# Patient Record
Sex: Female | Born: 1975 | Race: White | Hispanic: No | Smoking: Never smoker
Health system: Southern US, Community
[De-identification: ages and names within clinical notes are randomized; demographics above are authoritative.]

## PROBLEM LIST (undated history)

## (undated) DIAGNOSIS — G43909 Migraine, unspecified, not intractable, without status migrainosus: Secondary | ICD-10-CM

## (undated) DIAGNOSIS — J45909 Unspecified asthma, uncomplicated: Secondary | ICD-10-CM

## (undated) DIAGNOSIS — F410 Panic disorder [episodic paroxysmal anxiety] without agoraphobia: Secondary | ICD-10-CM

## (undated) DIAGNOSIS — R51 Headache: Secondary | ICD-10-CM

## (undated) DIAGNOSIS — T7840XA Allergy, unspecified, initial encounter: Secondary | ICD-10-CM

## (undated) DIAGNOSIS — R519 Headache, unspecified: Secondary | ICD-10-CM

## (undated) HISTORY — DX: Unspecified asthma, uncomplicated: J45.909

## (undated) HISTORY — DX: Allergy, unspecified, initial encounter: T78.40XA

## (undated) HISTORY — DX: Headache, unspecified: R51.9

## (undated) HISTORY — DX: Migraine, unspecified, not intractable, without status migrainosus: G43.909

## (undated) HISTORY — DX: Panic disorder (episodic paroxysmal anxiety): F41.0

## (undated) HISTORY — DX: Headache: R51

---

## 1999-04-09 HISTORY — PX: DILATION AND CURETTAGE OF UTERUS: SHX78

## 2012-12-01 ENCOUNTER — Encounter: Payer: Self-pay | Admitting: Adult Health

## 2012-12-01 ENCOUNTER — Ambulatory Visit (INDEPENDENT_AMBULATORY_CARE_PROVIDER_SITE_OTHER): Payer: BC Managed Care – HMO | Admitting: Adult Health

## 2012-12-01 VITALS — BP 118/72 | HR 82 | Temp 98.2°F | Resp 12 | Ht 64.0 in | Wt 148.0 lb

## 2012-12-01 DIAGNOSIS — Z889 Allergy status to unspecified drugs, medicaments and biological substances status: Secondary | ICD-10-CM | POA: Insufficient documentation

## 2012-12-01 DIAGNOSIS — J45909 Unspecified asthma, uncomplicated: Secondary | ICD-10-CM

## 2012-12-01 DIAGNOSIS — Z9109 Other allergy status, other than to drugs and biological substances: Secondary | ICD-10-CM

## 2012-12-01 DIAGNOSIS — R51 Headache: Secondary | ICD-10-CM

## 2012-12-01 DIAGNOSIS — Z0000 Encounter for general adult medical examination without abnormal findings: Secondary | ICD-10-CM

## 2012-12-01 MED ORDER — ALBUTEROL SULFATE HFA 108 (90 BASE) MCG/ACT IN AERS: 2.0000 | INHALATION_SPRAY | Freq: Four times a day (QID) | RESPIRATORY_TRACT | Status: DC | PRN

## 2012-12-01 MED ORDER — TETANUS-DIPHTH-ACELL PERTUSSIS 5-2.5-18.5 LF-MCG/0.5 IM SUSP: 0.5000 mL | Freq: Once | INTRAMUSCULAR | Status: AC

## 2012-12-01 NOTE — Assessment & Plan Note (Addendum)
Normal physical exam excluding breast and PAP. She will schedule this exam at a later time. Tdap vaccine updated today. Check labs: cbc w/diff, bmet, hepatic panel, tsh, lipids, vitamin d. Patient is being referred for allergy testing.

## 2012-12-01 NOTE — Assessment & Plan Note (Signed)
Trigger sinus headaches. Will send for allergy testing.

## 2012-12-01 NOTE — Addendum Note (Signed)
Addended by: Chandra Batch E on: 12/01/2012 03:29 PM   Modules accepted: Orders

## 2012-12-01 NOTE — Progress Notes (Signed)
  Subjective:    Patient ID: Erin [name], female    DOB: 11-Jan-1976, 37 y.o.   MRN: [id number]  HPI  Patient is a pleasant 37 y/o female who presents to clinic to establish care. She has not seen a PCP in approximately 10 years.   Review of Systems  Constitutional: Negative.   HENT: Positive for postnasal drip and sinus pressure.   Eyes: Negative.   Respiratory:       Occasional chest tightness with asthma flare. These are few and far between.  Cardiovascular: Negative.   Gastrointestinal: Negative.   Endocrine: Negative.   Genitourinary: Positive for menstrual problem. Negative for dysuria, urgency, frequency, hematuria, flank pain, vaginal discharge, vaginal pain and pelvic pain.  Musculoskeletal: Positive for back pain. Negative for joint swelling.       Hx of low back pain with sciatic nerve involvement. None at present.  Skin: Negative.   Allergic/Immunologic: Positive for environmental allergies.       Seasonal allergies - mild  Neurological: Positive for headaches. Negative for dizziness, tremors, seizures, syncope, speech difficulty, weakness, light-headedness and numbness.       Hx of headache. Has been getting sinus headache.  Hematological: Negative.   Psychiatric/Behavioral: Negative for suicidal ideas, behavioral problems, confusion, self-injury, decreased concentration and agitation. The patient is nervous/anxious.     BP 118/72  Pulse 82  Temp(Src) 98.2 F (36.8 C) (Oral)  Resp 12  Ht 5\' 4"  (1.626 m)  Wt 148 lb (67.132 kg)  BMI 25.39 kg/m2  SpO2 98%  LMP 11/23/2012    Objective:   Physical Exam  Constitutional: She is oriented to person, place, and time. She appears well-developed and well-nourished. No distress.  HENT:  Head: Normocephalic and atraumatic.  Right Ear: External ear normal.  Left Ear: External ear normal.  Mouth/Throat: Oropharynx is clear and moist.  Eyes: Conjunctivae and EOM are normal. Pupils are equal, round, and reactive to light.   Neck: Normal range of motion. Neck supple. No tracheal deviation present. No thyromegaly present.  Cardiovascular: Normal rate, regular rhythm, normal heart sounds and intact distal pulses.  Exam reveals no gallop and no friction rub.   No murmur heard. Pulmonary/Chest: Effort normal and breath sounds normal. No respiratory distress. She has no wheezes. She has no rales. She exhibits no tenderness.  Abdominal: Soft. Bowel sounds are normal. She exhibits no distension and no mass. There is no tenderness. There is no rebound and no guarding.  Musculoskeletal: Normal range of motion. She exhibits no edema and no tenderness.  Lymphadenopathy:    She has no cervical adenopathy.  Neurological: She is alert and oriented to person, place, and time. She has normal reflexes. She displays no tremor and normal reflexes. No cranial nerve deficit. She exhibits normal muscle tone. Coordination and gait normal.  Skin: Skin is warm and dry. No rash noted. No erythema. No pallor.  Psychiatric: She has a normal mood and affect. Her behavior is normal. Judgment and thought content normal.          Assessment & Plan:

## 2012-12-01 NOTE — Assessment & Plan Note (Signed)
Well controlled. Send in prescription for Albuterol inhaler.

## 2012-12-01 NOTE — Assessment & Plan Note (Signed)
She has been experiencing sinus headaches weekly. She believes they are allergy related. Respond to ibuprofen. Refer for allergy testing. Provided with sample of nasonex nasal spray.

## 2012-12-01 NOTE — Patient Instructions (Addendum)
   Thank you for choosing [facility] for your health care needs.  Please return for fasting labs at your earliest convenience.  The results will be available through MyChart for your convenience. Please remember to activate this. The activation code is located at the end of this form.  I am referring you for Allergy testing.   Remember to schedule your PAP at your earliest convenience.  Please call with any questions or concerns.

## 2012-12-15 ENCOUNTER — Encounter: Payer: Self-pay | Admitting: Adult Health

## 2012-12-15 ENCOUNTER — Other Ambulatory Visit (HOSPITAL_COMMUNITY)
Admission: RE | Admit: 2012-12-15 | Discharge: 2012-12-15 | Disposition: A | Discharge status: Home / Self Care | Payer: BC Managed Care – HMO | Source: Ambulatory Visit | Attending: Adult Health | Admitting: Adult Health

## 2012-12-15 ENCOUNTER — Ambulatory Visit (INDEPENDENT_AMBULATORY_CARE_PROVIDER_SITE_OTHER): Payer: BC Managed Care – HMO | Admitting: Adult Health

## 2012-12-15 VITALS — BP 110/76 | HR 97 | Resp 12 | Ht 64.0 in | Wt 144.0 lb

## 2012-12-15 DIAGNOSIS — Z01419 Encounter for gynecological examination (general) (routine) without abnormal findings: Secondary | ICD-10-CM | POA: Insufficient documentation

## 2012-12-15 DIAGNOSIS — Z124 Encounter for screening for malignant neoplasm of cervix: Secondary | ICD-10-CM

## 2012-12-15 DIAGNOSIS — Z1151 Encounter for screening for human papillomavirus (HPV): Secondary | ICD-10-CM | POA: Insufficient documentation

## 2012-12-15 NOTE — Assessment & Plan Note (Signed)
Physical exam normal including breast exam. PAP sent.

## 2012-12-15 NOTE — Progress Notes (Signed)
  Subjective:    Patient ID: Erin [name], female    DOB: Dec 06, 1975, 37 y.o.   MRN: [id number]  HPI  Patient is a pleasant 37 y/o female who presents to clinic for her breast and pelvic exam.  She has not concerns at this time.   Current Outpatient Prescriptions on File Prior to Visit  Medication Sig Dispense Refill  . albuterol (PROVENTIL HFA;VENTOLIN HFA) 108 (90 BASE) MCG/ACT inhaler Inhale 2 puffs into the lungs every 6 (six) hours as needed for wheezing.  1 Inhaler  6   Current Facility-Administered Medications on File Prior to Visit  Medication Dose Route Frequency Provider Last Rate Last Dose  . TDaP (BOOSTRIX) injection 0.5 mL  0.5 mL Intramuscular Once Saysha Menta, NP         Review of Systems  Constitutional: Negative.   Respiratory: Negative.   Cardiovascular: Negative.   Gastrointestinal: Negative.   Genitourinary: Negative.   Neurological: Negative.   Psychiatric/Behavioral: Negative.   All other systems reviewed and are negative.     BP 110/76  Pulse 97  Resp 12  Ht 5\' 4"  (1.626 m)  Wt 144 lb (65.318 kg)  BMI 24.71 kg/m2  SpO2 99%  LMP 11/23/2012     Objective:   Physical Exam  Constitutional: She appears well-developed and well-nourished. No distress.  Abdominal: Hernia confirmed negative in the right inguinal area and confirmed negative in the left inguinal area.  Genitourinary: Rectum normal, vagina normal and uterus normal. Rectal exam shows no external hemorrhoid and no internal hemorrhoid. Guaiac negative stool.    No breast swelling, tenderness, discharge or bleeding. No labial fusion. There is no rash, tenderness, lesion or injury on the right labia. There is no rash, tenderness, lesion or injury on the left labia. Cervix exhibits no discharge and no friability. Right adnexum displays no mass, no tenderness and no fullness. Left adnexum displays no mass, no tenderness and no fullness. No erythema, tenderness or bleeding around the vagina. No  foreign body around the vagina. No signs of injury around the vagina. No vaginal discharge found.  Lymphadenopathy:       Right: No inguinal adenopathy present.       Left: No inguinal adenopathy present.          Assessment & Plan:

## 2012-12-15 NOTE — Addendum Note (Signed)
Addended by: Montine Circle D on: 12/15/2012 01:42 PM   Modules accepted: Orders

## 2012-12-18 ENCOUNTER — Other Ambulatory Visit (INDEPENDENT_AMBULATORY_CARE_PROVIDER_SITE_OTHER): Payer: BC Managed Care – HMO

## 2012-12-18 DIAGNOSIS — Z0000 Encounter for general adult medical examination without abnormal findings: Secondary | ICD-10-CM

## 2012-12-18 LAB — CBC WITH DIFFERENTIAL/PLATELET
Basophils Absolute: 0 10*3/uL (ref 0.0–0.1)
Basophils Relative: 0.5 % (ref 0.0–3.0)
Eosinophils Absolute: 0.2 10*3/uL (ref 0.0–0.7)
HCT: 38.6 % (ref 36.0–46.0)
Hemoglobin: 13.1 g/dL (ref 12.0–15.0)
Lymphocytes Relative: 21.9 % (ref 12.0–46.0)
Lymphs Abs: 1.3 10*3/uL (ref 0.7–4.0)
MCHC: 33.8 g/dL (ref 30.0–36.0)
MCV: 93.2 fl (ref 78.0–100.0)
Neutro Abs: 4 10*3/uL (ref 1.4–7.7)
RBC: 4.14 Mil/uL (ref 3.87–5.11)
RDW: 13 % (ref 11.5–14.6)

## 2012-12-18 LAB — HEPATIC FUNCTION PANEL
ALT: 9 U/L (ref 0–35)
Albumin: 4.3 g/dL (ref 3.5–5.2)
Alkaline Phosphatase: 53 U/L (ref 39–117)
Bilirubin, Direct: 0.1 mg/dL (ref 0.0–0.3)
Total Protein: 7.4 g/dL (ref 6.0–8.3)

## 2012-12-18 LAB — BASIC METABOLIC PANEL
CO2: 23 mEq/L (ref 19–32)
Calcium: 9.1 mg/dL (ref 8.4–10.5)
Chloride: 110 mEq/L (ref 96–112)
Creatinine, Ser: 0.7 mg/dL (ref 0.4–1.2)
Glucose, Bld: 91 mg/dL (ref 70–99)
Sodium: 137 mEq/L (ref 135–145)

## 2012-12-18 LAB — LIPID PANEL
Cholesterol: 175 mg/dL (ref 0–200)
LDL Cholesterol: 119 mg/dL — ABNORMAL HIGH (ref 0–99)
Triglycerides: 107 mg/dL (ref 0.0–149.0)
VLDL: 21.4 mg/dL (ref 0.0–40.0)

## 2012-12-18 LAB — TSH: TSH: 1.22 u[IU]/mL (ref 0.35–5.50)

## 2013-02-04 ENCOUNTER — Ambulatory Visit (INDEPENDENT_AMBULATORY_CARE_PROVIDER_SITE_OTHER): Payer: BC Managed Care – HMO | Admitting: Adult Health

## 2013-02-04 ENCOUNTER — Encounter: Payer: Self-pay | Admitting: Adult Health

## 2013-02-04 VITALS — BP 120/82 | HR 110 | Temp 98.4°F | Resp 12 | Wt 147.0 lb

## 2013-02-04 DIAGNOSIS — M6580 Other synovitis and tenosynovitis, unspecified site: Secondary | ICD-10-CM

## 2013-02-04 DIAGNOSIS — M778 Other enthesopathies, not elsewhere classified: Secondary | ICD-10-CM | POA: Insufficient documentation

## 2013-02-04 NOTE — Assessment & Plan Note (Addendum)
Medial and lateral epicondylitis - Rest extremity. Avoid repetitive wrist extension and overuse of extremity. Ice, ibuprofen. May consider a counterforce brace applied over the upper portion of the lower arm. If no improvement will consider physical therapy and/or referral to ortho for evaluation and possible corticosteroid injection.

## 2013-02-04 NOTE — Patient Instructions (Signed)
  Take ibuprofen 600 mg every 6 hours for the next 4-5 days. Take with food.  Immobilize the joint - use a sling for a few days. Counter force elbow brace will also help improve symptoms.  Ice the area 4 times a day for the next 3-4 days.  If not any better in 2 weeks please let me know and I will refer you to ortho.

## 2013-02-04 NOTE — Progress Notes (Signed)
  Subjective:    Patient ID: Erin [name], female    DOB: 07/19/1975, 37 y.o.   MRN: [id number]  HPI  Pt is pleasant 37 yo female, presents to clinic with right elbow pain and tingling in right hand since Sunday; denies any acute injury. Pt states she noticed a small lump at elbow 2 days ago.  Pt states pain is worse with palpation and certain movements, particularly driving.  She is ambidextrous   Review of Systems  Musculoskeletal: Positive for arthralgias and joint swelling.       Right elbow pain and mild swelling  Skin: Negative for color change, rash and wound.       Objective:   Physical Exam  Constitutional: She is oriented to person, place, and time. She appears well-developed and well-nourished.  Musculoskeletal: Normal range of motion. She exhibits tenderness.       Right elbow: She exhibits swelling. She exhibits normal range of motion, no effusion and no deformity. Tenderness found. Medial epicondyle and lateral epicondyle tenderness noted. No olecranon process tenderness noted.  Neurological: She is alert and oriented to person, place, and time.  Skin: Skin is warm and dry.  Psychiatric: She has a normal mood and affect. Her behavior is normal. Judgment and thought content normal.    BP 120/82  Pulse 110  Temp(Src) 98.4 F (36.9 C) (Oral)  Resp 12  Wt 147 lb (66.679 kg)  BMI 25.22 kg/m2  SpO2 97%       Assessment & Plan:

## 2013-02-11 ENCOUNTER — Other Ambulatory Visit: Payer: Self-pay

## 2014-04-21 ENCOUNTER — Encounter: Payer: Self-pay | Admitting: Nurse Practitioner

## 2014-04-21 ENCOUNTER — Ambulatory Visit (INDEPENDENT_AMBULATORY_CARE_PROVIDER_SITE_OTHER): Payer: Managed Care, Other (non HMO) | Admitting: Nurse Practitioner

## 2014-04-21 VITALS — BP 118/80 | HR 96 | Temp 98.1°F | Resp 12 | Ht 64.0 in | Wt 133.0 lb

## 2014-04-21 DIAGNOSIS — B309 Viral conjunctivitis, unspecified: Secondary | ICD-10-CM | POA: Insufficient documentation

## 2014-04-21 NOTE — Progress Notes (Signed)
Pre visit review using our clinic review tool, if applicable. No additional management support is needed unless otherwise documented below in the visit note. 

## 2014-04-21 NOTE — Patient Instructions (Addendum)
Ocuhist, Visine-A, or Naphcon-A are good options for eye drops and will help with symptoms.   Mucinex if you are having issues with mucous/congestion, cough drops with menthol for sore throat.   Viral Conjunctivitis Conjunctivitis is an irritation (inflammation) of the clear membrane that covers the white part of the eye (the conjunctiva). The irritation can also happen on the underside of the eyelids. Conjunctivitis makes the eye red or pink in color. This is what is commonly known as pink eye. Viral conjunctivitis can spread easily (contagious). CAUSES   Infection from virus on the surface of the eye.  Infection from the irritation or injury of nearby tissues such as the eyelids or cornea.  More serious inflammation or infection on the inside of the eye.  Other eye diseases.  The use of certain eye medications. SYMPTOMS  The normally white color of the eye or the underside of the eyelid is usually pink or red in color. The pink eye is usually associated with irritation, tearing and some sensitivity to light. Viral conjunctivitis is often associated with a clear, watery discharge. If a discharge is present, there may also be some blurred vision in the affected eye. DIAGNOSIS  Conjunctivitis is diagnosed by an eye exam. The eye specialist looks for changes in the surface tissues of the eye which take on changes characteristic of the specific types of conjunctivitis. A sample of any discharge may be collected on a Q-Tip (sterile swap). The sample will be sent to a lab to see whether or not the inflammation is caused by bacterial or viral infection. TREATMENT  Viral conjunctivitis will not respond to medicines that kill germs (antibiotics). Treatment is aimed at stopping a bacterial infection on top of the viral infection. The goal of treatment is to relieve symptoms (such as itching) with antihistamine drops or other eye medications.  HOME CARE INSTRUCTIONS   To ease discomfort, apply a cool,  clean wash cloth to your eye for 10 to 20 minutes, 3 to 4 times a day.  Gently wipe away any drainage from the eye with a warm, wet washcloth or a cotton ball.  Wash your hands often with soap and use paper towels to dry.  Do not share towels or washcloths. This may spread the infection.  Change or wash your pillowcase every day.  You should not use eye make-up until the infection is gone.  Stop using contacts lenses. Ask your eye professional how to sterilize or replace them before using again. This depends on the type of contact lenses used.  Do not touch the edge of the eyelid with the eye drop bottle or ointment tube when applying medications to the affected eye. This will stop you from spreading the infection to the other eye or to others. SEEK IMMEDIATE MEDICAL CARE IF:   The infection has not improved within 3 days of beginning treatment.  A watery discharge from the eye develops.  Pain in the eye increases.  The redness is spreading.  Vision becomes blurred.  An oral temperature above 102 F (38.9 C) develops, or as your caregiver suggests.  Facial pain, redness or swelling develops.  Any problems that may be related to the prescribed medicine develop. MAKE SURE YOU:   Understand these instructions.  Will watch your condition.  Will get help right away if you are not doing well or get worse. Document Released: 03/25/2005 Document Revised: 06/17/2011 Document Reviewed: 11/12/2007 [facility] Patient Information 2015 [employer] [place]. This information is not intended to replace  advice given to you by your health care provider. Make sure you discuss any questions you have with your health care provider.  

## 2014-04-21 NOTE — Assessment & Plan Note (Signed)
Worsening. Instructed to stop abx eye drops, stay home from work for 24 hours. Start anti-histamine eye drops, gave handout with home treatment tips, when to seek immediate care, and signs/symptoms. Pt verbalized understanding. FU prn worsening/failure to improve

## 2014-04-21 NOTE — Progress Notes (Signed)
Subjective:    Patient ID: Erin [name], female    DOB: May 14, 1975, 39 y.o.   MRN: [id number]  HPI  1) [name] is a 39 yo female with a CC of following up about Pink eye on right. Urgent Care on Monday and currently having a sore throat.   04/18/14[facility] [address] [place]. Taking eye drops.   Started having a sore throat. Began 1 day ago. Spread from right eye to left yesterday. Describes eyes stuck shut in am, watery d.c throughout the day. Taking abx eye drops at this time as prescribed by Urgent care.   Review of Systems  HENT: Positive for congestion, postnasal drip, rhinorrhea and sore throat. Negative for ear discharge, ear pain, sinus pressure and sneezing.   Eyes: Positive for photophobia, pain, discharge, redness and itching. Negative for visual disturbance.       Watery, mild eye discomfort when looking down only  Respiratory: Negative for cough, chest tightness and wheezing.   Cardiovascular: Negative for chest pain, palpitations and leg swelling.  Gastrointestinal: Negative for nausea, vomiting and diarrhea.  Skin: Negative for rash.   Past Medical History  Diagnosis Date  . Frequent headaches   . Asthma   . Allergy   . Migraine   . Panic attacks     Hx of - she has had approximately 5-6 in 13 years.    History   Social History  . Marital Status: Married    Spouse Name: Erin MageJuan    Number of Children: 2  . Years of Education: 12   Occupational History  . Warden/rangerHuman Resources Associate     [employer]   Social History Main Topics  . Smoking status: Never Smoker   . Smokeless tobacco: Never Used  . Alcohol Use: 1.2 oz/week    2 Glasses of wine per week  . Drug Use: No  . Sexual Activity: Yes    Birth Control/ Protection: Condom, Other-see comments     Comment: husband recent vasectomy   Other Topics Concern  . Not on file   Social History Narrative   Erin [name] was born in [place]. [place] and lived in [place] a total of [place] years. [place] her [place] family was living and also for her husband employment. She has been married for 16 years. They have 2 children (1 son age 41 and daughter age 41). 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 She enjoys Architectrefinishing furniture. She has recently started yoga and has really been enjoying this. She also enjoys reading.    Past Surgical History  Procedure Laterality Date  . Cesarean section  2000, 2004  . Dilation and curettage of uterus  2001    Family History  Problem Relation Age of Onset  . Arthritis Mother   . Heart disease Mother   . Hypertension Mother   . Arrhythmia Mother   . Asthma Brother   . Cancer Maternal Aunt [id number]    breast cancer - died age 41  . Sleep apnea Brother   . Asthma Father   . Cirrhosis Father   . Hepatitis C Father     Contracted through IV - heroin    No Known Allergies  No current outpatient prescriptions on file prior to visit.   Current Facility-Administered Medications on File Prior to Visit  Medication Dose Route Frequency Provider Last Rate Last Dose  .  TDaP (BOOSTRIX) injection 0.5 mL  0.5 mL Intramuscular Once Raquel Conni Elliot, NP           Objective:   Physical Exam  Constitutional: She is oriented to person, place, and time.  Eyes: Pupils are equal, round, and reactive to light. Right eye exhibits discharge. Right eye exhibits no chemosis, no exudate and no hordeolum. No foreign body present in the right eye. Left eye exhibits discharge. Left eye exhibits no chemosis, no exudate and no hordeolum. No foreign body present in the left eye. Right conjunctiva is injected. Right conjunctiva has no hemorrhage. Left conjunctiva is injected. Left conjunctiva has no hemorrhage. Right eye exhibits normal extraocular motion and no nystagmus. Left eye exhibits normal extraocular motion and no nystagmus.  Watery discharge from bilateral eyes    Cardiovascular: Normal rate and regular rhythm.   Pulmonary/Chest: Effort normal and breath sounds normal.  Neurological: She is alert and oriented to person, place, and time.  Skin: Skin is warm and dry. No rash noted.  Psychiatric: She has a normal mood and affect. Her behavior is normal. Judgment and thought content normal.    BP 118/80 mmHg  Pulse 96  Temp(Src) 98.1 F (36.7 C) (Oral)  Resp 12  Ht  (1.626 m)  Wt 133 lb (60.328 kg)  BMI 22.82 kg/m2  SpO2 99%      Assessment & Plan:

## 2014-05-27 ENCOUNTER — Encounter: Payer: Managed Care, Other (non HMO) | Admitting: Nurse Practitioner

## 2014-06-22 ENCOUNTER — Ambulatory Visit: Payer: Managed Care, Other (non HMO) | Admitting: Nurse Practitioner

## 2014-06-27 ENCOUNTER — Ambulatory Visit (INDEPENDENT_AMBULATORY_CARE_PROVIDER_SITE_OTHER): Payer: Managed Care, Other (non HMO) | Admitting: Nurse Practitioner

## 2014-06-27 ENCOUNTER — Encounter: Payer: Self-pay | Admitting: Nurse Practitioner

## 2014-06-27 VITALS — BP 102/68 | HR 122 | Temp 98.3°F | Resp 12 | Ht 64.0 in | Wt 136.0 lb

## 2014-06-27 DIAGNOSIS — N939 Abnormal uterine and vaginal bleeding, unspecified: Secondary | ICD-10-CM

## 2014-06-27 LAB — CBC WITH DIFFERENTIAL/PLATELET
BASOS ABS: 0 10*3/uL (ref 0.0–0.1)
Basophils Relative: 0.5 % (ref 0.0–3.0)
EOS PCT: 0.9 % (ref 0.0–5.0)
Eosinophils Absolute: 0.1 10*3/uL (ref 0.0–0.7)
HCT: 37.2 % (ref 36.0–46.0)
HEMOGLOBIN: 12.8 g/dL (ref 12.0–15.0)
LYMPHS ABS: 1.3 10*3/uL (ref 0.7–4.0)
Lymphocytes Relative: 16.3 % (ref 12.0–46.0)
MCHC: 34.3 g/dL (ref 30.0–36.0)
MCV: 92.6 fl (ref 78.0–100.0)
MONO ABS: 0.4 10*3/uL (ref 0.1–1.0)
MONOS PCT: 5.6 % (ref 3.0–12.0)
NEUTROS ABS: 6 10*3/uL (ref 1.4–7.7)
Neutrophils Relative %: 76.7 % (ref 43.0–77.0)
PLATELETS: 355 10*3/uL (ref 150.0–400.0)
RBC: 4.01 Mil/uL (ref 3.87–5.11)
RDW: 12.4 % (ref 11.5–15.5)
WBC: 7.9 10*3/uL (ref 4.0–10.5)

## 2014-06-27 LAB — COMPREHENSIVE METABOLIC PANEL
ALBUMIN: 4.4 g/dL (ref 3.5–5.2)
ALK PHOS: 54 U/L (ref 39–117)
ALT: 13 U/L (ref 0–35)
AST: 15 U/L (ref 0–37)
BUN: 8 mg/dL (ref 6–23)
CALCIUM: 9.6 mg/dL (ref 8.4–10.5)
CHLORIDE: 105 meq/L (ref 96–112)
CO2: 24 mEq/L (ref 19–32)
CREATININE: 0.74 mg/dL (ref 0.40–1.20)
GFR: 91.88 mL/min (ref >60.00)
Glucose, Bld: 106 mg/dL — ABNORMAL HIGH (ref 70–99)
POTASSIUM: 3.9 meq/L (ref 3.5–5.1)
Sodium: 137 mEq/L (ref 135–145)
Total Bilirubin: 0.3 mg/dL (ref 0.2–1.2)
Total Protein: 7.7 g/dL (ref 6.0–8.3)

## 2014-06-27 LAB — IBC PANEL
Iron: 113 ug/dL (ref 42–145)
Saturation Ratios: 32 % (ref 20.0–50.0)
TRANSFERRIN: 252 mg/dL (ref 212.0–360.0)

## 2014-06-27 LAB — FERRITIN: FERRITIN: 131.4 ng/mL (ref 10.0–291.0)

## 2014-06-27 NOTE — Progress Notes (Signed)
Pre visit review using our clinic review tool, if applicable. No additional management support is needed unless otherwise documented below in the visit note. 

## 2014-06-27 NOTE — Assessment & Plan Note (Signed)
Pt is concerned about early heavy period. We will test for CBC w/ diff and iron studies as well as a CMET. It is manageable today, but she is anxious about why it is early and heavier than usual. Will follow up in 1 week and contact her with results. Advised OTC Ferrous sulfate 325 mg daily.

## 2014-06-27 NOTE — Patient Instructions (Signed)
Start today on OTC iron- Ferrous sulfate 325 mg.   Will follow up in 1 week.  We will contact you with results.

## 2014-06-27 NOTE — Progress Notes (Signed)
Subjective:    Patient ID: Erin [name], female    DOB: 1975-12-29, 39 y.o.   MRN: [id number]  HPI  Erin [name] is a 39 yo female with a CC of abnormal uterine bleeding.   1) Dx with a UTI at urgent care last week on Tuesday. Took Cipro for 3 days. Complaints of dysuria, blood in urine, started spotting over the previous weekend 3/12 and then Tuesday started with heavy bleeding. She is a week and a few days early. She is usually regular with a cycle lasting 4 days. She had a 99.7 temp over that weekend and a 101 when at the urgent care.   Today she is having bright red bleeding vaginally, changing pads and tampons more often than usual she is day 7 of bleeding. She has been anemic in past. Started eating more spinach to add iron to diet. She is still sexually active and her partner has a vasectomy.   Review of Systems  Constitutional: Negative for fever, chills, diaphoresis and fatigue.  Respiratory: Negative for chest tightness, shortness of breath and wheezing.   Cardiovascular: Negative for chest pain, palpitations and leg swelling.  Gastrointestinal: Negative for nausea, vomiting, abdominal pain, diarrhea, constipation, blood in stool and abdominal distention.  Genitourinary: Positive for vaginal bleeding and menstrual problem. Negative for dysuria.  Skin: Negative for rash.  Neurological: Positive for light-headedness. Negative for dizziness, weakness, numbness and headaches.       Light headed when standing  Psychiatric/Behavioral: The patient is not nervous/anxious.    Past Medical History  Diagnosis Date  . Frequent headaches   . Asthma   . Allergy   . Migraine   . Panic attacks     Hx of - she has had approximately 5-6 in 13 years.    History   Social History  . Marital Status: Married    Spouse Name: Lars MageJuan  . Number of Children: 2  . Years of Education: 12   Occupational History  . 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 Smoking status: Never Smoker   . Smokeless tobacco: Never Used  . Alcohol Use: 1.2 oz/week    2 Glasses of wine per week  . Drug Use: No  . Sexual Activity: Yes    Birth Control/ Protection: Condom, Other-see comments     Comment: husband recent vasectomy   Other Topics Concern  . Not on file   Social History Narrative   [place]avan was born [place]. [place] and lived in [place] a total [place] years. [place] where most of her husband family was living and also for her husband employment. She has been married for 16 years. They have 2 children (1 son age 41 and daughter age 41). 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 She has recently started yoga and has really been enjoying this. She also enjoys reading.    Past Surgical History  Procedure Laterality Date  . Cesarean section  2000, 2004  . Dilation and curettage of uterus  2001    Family History  Problem Relation Age of Onset  . Arthritis Mother   . Heart disease Mother   . Hypertension Mother   . Arrhythmia Mother   . Asthma Brother   . Cancer Maternal Aunt [id number]    breast cancer - died age 41  .  Sleep apnea Brother   . Asthma Father   . Cirrhosis Father   . Hepatitis C Father     Contracted through IV - heroin    No Known Allergies  No current outpatient prescriptions on file prior to visit.   Current Facility-Administered Medications on File Prior to Visit  Medication Dose Route Frequency Provider Last Rate Last Dose  . TDaP (BOOSTRIX) injection 0.5 mL  0.5 mL Intramuscular Once Raquel Conni Elliot, NP           Objective:   Physical Exam  Constitutional: She is oriented to person, place, and time. She appears well-developed and well-nourished. No distress.  BP 102/68 mmHg  Pulse 122  Temp(Src) 98.3 F (36.8 C) (Oral)  Resp 12  Ht  (1.626 m)  Wt 136 lb (61.689 kg)  BMI 23.33 kg/m2  SpO2  97%  LMP 06/21/2014 Repeat pulse 113 Orthostatic- Lying 136/91 104                     Sitting 140/98 103                     Standing 158/107 112 No light headedness, but feels palpitations  HENT:  Head: Normocephalic and atraumatic.  Right Ear: External ear normal.  Left Ear: External ear normal.  Cardiovascular: Regular rhythm and normal heart sounds.  Exam reveals no gallop and no friction rub.   No murmur heard. Rate is fast today she is very anxious  Pulmonary/Chest: Effort normal and breath sounds normal. No respiratory distress. She has no wheezes. She has no rales. She exhibits no tenderness.  Neurological: She is alert and oriented to person, place, and time. No cranial nerve deficit. She exhibits normal muscle tone. Coordination normal.  Skin: Skin is warm and dry. No rash noted. She is not diaphoretic.  Psychiatric: She has a normal mood and affect. Her behavior is normal. Judgment and thought content normal.      Assessment & Plan:

## 2014-06-28 ENCOUNTER — Encounter: Payer: Self-pay | Admitting: Nurse Practitioner

## 2014-07-04 ENCOUNTER — Ambulatory Visit (INDEPENDENT_AMBULATORY_CARE_PROVIDER_SITE_OTHER): Payer: Managed Care, Other (non HMO) | Admitting: Nurse Practitioner

## 2014-07-04 ENCOUNTER — Encounter: Payer: Self-pay | Admitting: Nurse Practitioner

## 2014-07-04 VITALS — BP 104/66 | HR 94 | Temp 98.1°F | Resp 12 | Ht 64.0 in | Wt 132.1 lb

## 2014-07-04 DIAGNOSIS — N939 Abnormal uterine and vaginal bleeding, unspecified: Secondary | ICD-10-CM | POA: Diagnosis not present

## 2014-07-04 NOTE — Assessment & Plan Note (Signed)
Pt stable on ferrous sulfate. Improving. Possible she is just perimenopausal. Next step is pelvic US if not stopping or continues to be heavy. Asked her to track periods, bleeding, and characteristics. FU in 3 months.

## 2014-07-04 NOTE — Progress Notes (Signed)
Pre visit review using our clinic review tool, if applicable. No additional management support is needed unless otherwise documented below in the visit note. 

## 2014-07-04 NOTE — Patient Instructions (Signed)
We will follow up in 3 months for your physical.   Call if the bleeding comes back, is heavy, or not stopping and we can put in a referral for an ultrasound

## 2014-07-04 NOTE — Progress Notes (Signed)
   Subjective:    Patient ID: Erin [name], female    DOB: October 26, 1975, 39 y.o.   MRN: [id number]  HPI  Erin [name] is a 39 yo female following up for abnormal uterine bleeding.   1) Thursday/Friday last week easing up and then got heavier. Lightest today Taking iron supplements, feels more energetic Mother- [id number] began menopause Started a tea- horsetail- anti-inflammatory properties. Started this on Friday.   Review of Systems  Constitutional: Negative for fever, chills, diaphoresis and fatigue.  Gastrointestinal: Negative for nausea, vomiting and diarrhea.  Genitourinary: Positive for menstrual problem.  Skin: Negative for rash.  Neurological: Negative for dizziness, weakness, numbness and headaches.  Psychiatric/Behavioral: The patient is not nervous/anxious.       Objective:   Physical Exam  Constitutional: She is oriented to person, place, and time. She appears well-developed and well-nourished. No distress.  BP 104/66 mmHg  Pulse 94  Temp(Src) 98.1 F (36.7 C) (Oral)  Resp 12  Ht 5\' 4"  (1.626 m)  Wt 132 lb 1.9 oz (59.929 kg)  BMI 22.67 kg/m2  SpO2 97%  LMP 06/21/2014   HENT:  Head: Normocephalic and atraumatic.  Right Ear: External ear normal.  Left Ear: External ear normal.  Eyes: Right eye exhibits no discharge. Left eye exhibits no discharge. No scleral icterus.  Cardiovascular: Normal rate and regular rhythm.   Neurological: She is alert and oriented to person, place, and time. No cranial nerve deficit. She exhibits normal muscle tone. Coordination normal.  Skin: Skin is warm and dry. No rash noted. She is not diaphoretic.  Psychiatric: She has a normal mood and affect. Her behavior is normal. Judgment and thought content normal.      Assessment & Plan:

## 2014-07-11 ENCOUNTER — Encounter: Payer: Self-pay | Admitting: Nurse Practitioner

## 2014-07-13 ENCOUNTER — Emergency Department
Admit: 2014-07-13 | Disposition: A | Discharge status: Home / Self Care | Payer: Self-pay | Admitting: Emergency Medicine

## 2014-07-13 LAB — URINALYSIS, COMPLETE
BACTERIA: NONE SEEN
BILIRUBIN, UR: NEGATIVE
Blood: NEGATIVE
GLUCOSE, UR: NEGATIVE mg/dL (ref 0–75)
KETONE: NEGATIVE
Leukocyte Esterase: NEGATIVE
NITRITE: NEGATIVE
Ph: 6 (ref 4.5–8.0)
Protein: NEGATIVE
RBC,UR: NONE SEEN /HPF (ref 0–5)
SPECIFIC GRAVITY: 1.005 (ref 1.003–1.030)
Squamous Epithelial: 1

## 2014-07-13 LAB — CBC WITH DIFFERENTIAL/PLATELET
BASOS PCT: 0.3 %
Basophil #: 0 10*3/uL (ref 0.0–0.1)
EOS ABS: 0.1 10*3/uL (ref 0.0–0.7)
EOS PCT: 1 %
HCT: 33.4 % — ABNORMAL LOW (ref 35.0–47.0)
HGB: 11.3 g/dL — AB (ref 12.0–16.0)
LYMPHS ABS: 0.9 10*3/uL — AB (ref 1.0–3.6)
Lymphocyte %: 10.2 %
MCH: 31.9 pg (ref 26.0–34.0)
MCHC: 33.9 g/dL (ref 32.0–36.0)
MCV: 94 fL (ref 80–100)
MONO ABS: 0.5 x10 3/mm (ref 0.2–0.9)
Monocyte %: 6.1 %
NEUTROS ABS: 7.4 10*3/uL — AB (ref 1.4–6.5)
Neutrophil %: 82.4 %
Platelet: 332 10*3/uL (ref 150–440)
RBC: 3.56 10*6/uL — AB (ref 3.80–5.20)
RDW: 12.9 % (ref 11.5–14.5)
WBC: 8.9 10*3/uL (ref 3.6–11.0)

## 2014-07-13 LAB — COMPREHENSIVE METABOLIC PANEL
ALBUMIN: 3.9 g/dL
ALK PHOS: 67 U/L
AST: 15 U/L
Anion Gap: 11 (ref 7–16)
BUN: 6 mg/dL
Bilirubin,Total: <0.1 mg/dL — ABNORMAL LOW
Calcium, Total: 9.2 mg/dL
Chloride: 104 mmol/L
Co2: 24 mmol/L
Creatinine: 0.66 mg/dL
EGFR (Non-African Amer.): >60
GLUCOSE: 98 mg/dL
Potassium: 3.5 mmol/L
SGPT (ALT): 11 U/L — ABNORMAL LOW
Sodium: 139 mmol/L
Total Protein: 8.3 g/dL — ABNORMAL HIGH

## 2014-07-13 LAB — WET PREP, GENITAL

## 2014-07-14 LAB — GC/CHLAMYDIA PROBE AMP

## 2014-10-07 ENCOUNTER — Ambulatory Visit: Payer: Managed Care, Other (non HMO) | Admitting: Nurse Practitioner

## 2016-02-20 ENCOUNTER — Encounter: Payer: Self-pay | Admitting: Emergency Medicine

## 2016-02-20 ENCOUNTER — Emergency Department: Payer: Managed Care, Other (non HMO)

## 2016-02-20 ENCOUNTER — Emergency Department
Admission: EM | Admit: 2016-02-20 | Discharge: 2016-02-20 | Disposition: A | Discharge status: Home / Self Care | Payer: Managed Care, Other (non HMO) | Attending: Emergency Medicine | Admitting: Emergency Medicine

## 2016-02-20 DIAGNOSIS — J45909 Unspecified asthma, uncomplicated: Secondary | ICD-10-CM | POA: Insufficient documentation

## 2016-02-20 DIAGNOSIS — S0083XA Contusion of other part of head, initial encounter: Secondary | ICD-10-CM | POA: Diagnosis not present

## 2016-02-20 DIAGNOSIS — Y92410 Unspecified street and highway as the place of occurrence of the external cause: Secondary | ICD-10-CM | POA: Diagnosis not present

## 2016-02-20 DIAGNOSIS — Y939 Activity, unspecified: Secondary | ICD-10-CM | POA: Insufficient documentation

## 2016-02-20 DIAGNOSIS — S0990XA Unspecified injury of head, initial encounter: Secondary | ICD-10-CM | POA: Diagnosis present

## 2016-02-20 DIAGNOSIS — Y999 Unspecified external cause status: Secondary | ICD-10-CM | POA: Diagnosis not present

## 2016-02-20 DIAGNOSIS — M7918 Myalgia, other site: Secondary | ICD-10-CM

## 2016-02-20 LAB — POCT PREGNANCY, URINE: Preg Test, Ur: NEGATIVE

## 2016-02-20 MED ORDER — HYDROCODONE-ACETAMINOPHEN 5-325 MG PO TABS: 1.0000 | ORAL_TABLET | ORAL | 0 refills | Status: AC | PRN

## 2016-02-20 MED ORDER — CYCLOBENZAPRINE HCL 10 MG PO TABS: 10.0000 mg | ORAL_TABLET | Freq: Three times a day (TID) | ORAL | 0 refills | Status: AC | PRN

## 2016-02-20 MED ORDER — MELOXICAM 15 MG PO TABS: 15.0000 mg | ORAL_TABLET | Freq: Every day | ORAL | 0 refills | Status: AC

## 2016-02-20 NOTE — Discharge Instructions (Signed)
Please take medications as instructed. Follow up with orthopedics for further treatment.

## 2016-02-20 NOTE — ED Notes (Signed)
Discharge instructions reviewed with patient. Patient verbalized understanding. Patient ambulated to lobby without difficulty.   
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
## 2019-05-23 ENCOUNTER — Other Ambulatory Visit: Payer: Self-pay

## 2019-05-23 ENCOUNTER — Emergency Department
Admission: EM | Admit: 2019-05-23 | Discharge: 2019-05-24 | Disposition: A | Discharge status: Home / Self Care | Payer: 59 | Attending: Emergency Medicine | Admitting: Emergency Medicine

## 2019-05-23 DIAGNOSIS — T783XXA Angioneurotic edema, initial encounter: Secondary | ICD-10-CM | POA: Diagnosis not present

## 2019-05-23 DIAGNOSIS — T782XXA Anaphylactic shock, unspecified, initial encounter: Secondary | ICD-10-CM | POA: Insufficient documentation

## 2019-05-23 DIAGNOSIS — R220 Localized swelling, mass and lump, head: Secondary | ICD-10-CM | POA: Diagnosis present

## 2019-05-23 LAB — BASIC METABOLIC PANEL
Anion gap: 11 (ref 5–15)
BUN: 13 mg/dL (ref 6–20)
CO2: 19 mmol/L — ABNORMAL LOW (ref 22–32)
Calcium: 9 mg/dL (ref 8.9–10.3)
Chloride: 108 mmol/L (ref 98–111)
Creatinine, Ser: 0.57 mg/dL (ref 0.44–1.00)
GFR calc Af Amer: >60 mL/min (ref >60)
GFR calc non Af Amer: >60 mL/min (ref >60)
Glucose, Bld: 113 mg/dL — ABNORMAL HIGH (ref 70–99)
Potassium: 3.5 mmol/L (ref 3.5–5.1)
Sodium: 138 mmol/L (ref 135–145)

## 2019-05-23 LAB — CBC
HCT: 38.9 % (ref 36.0–46.0)
Hemoglobin: 13.2 g/dL (ref 12.0–15.0)
MCH: 32.5 pg (ref 26.0–34.0)
MCHC: 33.9 g/dL (ref 30.0–36.0)
MCV: 95.8 fL (ref 80.0–100.0)
Platelets: 335 10*3/uL (ref 150–400)
RBC: 4.06 MIL/uL (ref 3.87–5.11)
RDW: 12.5 % (ref 11.5–15.5)
WBC: 8 10*3/uL (ref 4.0–10.5)
nRBC: 0 % (ref 0.0–0.2)

## 2019-05-23 MED ORDER — EPINEPHRINE 0.3 MG/0.3ML IJ SOAJ
0.3000 mg | Freq: Once | INTRAMUSCULAR | Status: AC
  Administered 2019-05-23: 0.3 mg via INTRAMUSCULAR

## 2019-05-23 MED ORDER — METHYLPREDNISOLONE SODIUM SUCC 125 MG IJ SOLR
125.0000 mg | Freq: Once | INTRAMUSCULAR | Status: AC
  Administered 2019-05-23: 22:00:00 125 mg via INTRAVENOUS
  Filled 2019-05-23: qty 2

## 2019-05-23 MED ORDER — FAMOTIDINE IN NACL 20-0.9 MG/50ML-% IV SOLN
20.0000 mg | Freq: Once | INTRAVENOUS | Status: AC
  Administered 2019-05-23: 22:00:00 20 mg via INTRAVENOUS
  Filled 2019-05-23: qty 50

## 2019-05-23 MED ORDER — DIPHENHYDRAMINE HCL 50 MG/ML IJ SOLN
25.0000 mg | Freq: Once | INTRAMUSCULAR | Status: AC
  Administered 2019-05-23: 22:00:00 25 mg via INTRAVENOUS
  Filled 2019-05-23: qty 1

## 2019-05-23 NOTE — ED Provider Notes (Signed)
[facility] Emergency Department Provider Note   ____________________________________________    I have reviewed the triage vital signs and the nursing notes.   HISTORY  Chief Complaint Allergic reaction    HPI Erin [name] is a 44 y.o. female who presents with likely allergic reaction.  Patient reports she developed itching and above her right eye followed by swelling approximately 1 hour ago.  Her left eye then began itching and swelling as well.  She feels an itching in the back of her throat, no difficulty breathing.  No rash reported.  She reports she had her right eye swell about a week ago similarly with itching and resolved on its own.  Denies a history of anaphylaxis.  Has taken some Benadryl.  Does report some nausea as well  Past Medical History:  Diagnosis Date  . Allergy   . Asthma   . Frequent headaches   . Migraine   . Panic attacks    Hx of - she has had approximately 5-6 in 13 years.    Patient Active Problem List   Diagnosis Date Noted  . Abnormal uterine bleeding 06/27/2014  . Viral conjunctivitis 04/21/2014  . Tendonitis of elbow, right 02/04/2013  . Encounter for cervical Pap smear with pelvic exam 12/15/2012  . Asthma 12/01/2012  . Sinus headache 12/01/2012  . Multiple allergies 12/01/2012  . Routine general medical examination at a health care facility 12/01/2012    Past Surgical History:  Procedure Laterality Date  . CESAREAN SECTION  2000, 2004  . DILATION AND CURETTAGE OF UTERUS  2001    Prior to Admission medications   Medication Sig Start Date End Date Taking? Authorizing Provider  cyclobenzaprine (FLEXERIL) 10 MG tablet Take 1 tablet (10 mg total) by mouth 3 (three) times daily as needed for muscle spasms. 02/20/16   Cuthriell, Delorise Royals, PA-C  HYDROcodone-acetaminophen (NORCO/VICODIN) 5-325 MG tablet Take 1 tablet by mouth every 4 (four) hours as needed for moderate pain. 02/20/16   Cuthriell, Delorise Royals, PA-C  Multiple Vitamin (MULTIVITAMIN) capsule Take 1 capsule by mouth daily.    [provider]     Allergies Patient has no known allergies.  Family History  Problem Relation Age of Onset  . Arthritis Mother   . Heart disease Mother   . Hypertension Mother   . Arrhythmia Mother   . Asthma Brother   . Cancer Maternal Aunt 82       breast cancer - died age 5  . Sleep apnea Brother   . Asthma Father   . Cirrhosis Father   . Hepatitis C Father        Contracted through IV - heroin    Social History Social History   Tobacco Use  . Smoking status: Never Smoker  . Smokeless tobacco: Never Used  Substance Use Topics  . Alcohol use: Yes    Alcohol/week: 2.0 standard drinks    Types: 2 Glasses of wine per week  . Drug use: No    Review of Systems  Constitutional: No fever/chills Eyes: As above ENT: As above Cardiovascular: Denies chest pain. Respiratory: Denies shortness of breath. Gastrointestinal: Positive nausea Genitourinary: Negative for dysuria. Musculoskeletal: Negative for back pain. Skin: Negative for rash. Neurological: Negative for headaches or weakness   ____________________________________________   PHYSICAL EXAM:  VITAL SIGNS: ED Triage Vitals [05/23/19 2137]  Enc Vitals Group     BP (!) 120/91     Pulse Rate 80  Resp 16     Temp 98 F (36.7 C)     Temp Source Oral     SpO2 100 %     Weight      Height      Head Circumference      Peak Flow      Pain Score      Pain Loc      Pain Edu?      Excl. in [place]?     Constitutional: Alert and oriented. Eyes: Eyelids are significantly swollen, eye swollen shut Head: Atraumatic. Nose: No congestion/rhinnorhea. Mouth/Throat: Mucous membranes are moist.  Pharynx is normal, no edema noted, no stridor Neck:  Painless ROM Cardiovascular: Normal rate, regular rhythm.  Good peripheral circulation. Respiratory: Normal respiratory effort.  No retractions.  Gastrointestinal: Soft and  nontender. No distention.    Musculoskeletal:  Warm and well perfused Neurologic:  Normal speech and language. No gross focal neurologic deficits are appreciated.  Skin:  Skin is warm, dry and intact. No rash noted. Psychiatric: Mood and affect are normal. Speech and behavior are normal.  ____________________________________________   LABS (all labs ordered are listed, but only abnormal results are displayed)  Labs Reviewed  BASIC METABOLIC PANEL - Abnormal; Notable for the following components:      Result Value   CO2 19 (*)    Glucose, Bld 113 (*)    All other components within normal limits  CBC   ____________________________________________  EKG  None ____________________________________________  RADIOLOGY  None ____________________________________________   PROCEDURES  Procedure(s) performed: No  Procedures   Critical Care performed: yes  CRITICAL CARE Performed by: Lavonia Drafts   Total critical care time:30 minutes  Critical care time was exclusive of separately billable procedures and treating other patients.  Critical care was necessary to treat or prevent imminent or life-threatening deterioration.  Critical care was time spent personally by me on the following activities: development of treatment plan with patient and/or surrogate as well as nursing, discussions with consultants, evaluation of patient's response to treatment, examination of patient, obtaining history from patient or surrogate, ordering and performing treatments and interventions, ordering and review of laboratory studies, ordering and review of radiographic studies, pulse oximetry and re-evaluation of patient's condition.  ____________________________________________   INITIAL IMPRESSION / ASSESSMENT AND PLAN / ED COURSE  Pertinent labs & imaging results that were available during my care of the patient were reviewed by me and considered in my medical decision making (see chart  for details).  Patient presents with likely allergic reaction, concerning for anaphylaxis given itching in his throat, significant swelling of the eyes.  Treated with IM epinephrine 0.3 mg, IV Solu-Medrol, IV Pepcid, IV Benadryl.  We will observe in the emergency department closely.  She is not on any blood pressure medications such as lisinopril.   ----------------------------------------- 10:46 PM on 05/23/2019 -----------------------------------------  Patient has had mild improvement in swelling in the right eye in particular    ____________________________________________   FINAL CLINICAL IMPRESSION(S) / ED DIAGNOSES  Final diagnoses:  Anaphylaxis, initial encounter  Angioedema, initial encounter        Note:  This document was prepared using Dragon voice recognition software and may include unintentional dictation errors.   Lavonia Drafts, MD 05/23/19 2246

## 2019-05-23 NOTE — ED Notes (Signed)
Pt assisted to bathroom

## 2019-05-23 NOTE — ED Triage Notes (Signed)
PT arrived to ED after drinking one beer with bilateral eye selling and a scratchy throat. No known allergies but pt reports she had one eye swell last week after eating whey. Pt able to speak in complete sentences without SOB and has no visible rash at this time.

## 2019-05-23 NOTE — ED Provider Notes (Signed)
-----------------------------------------   11:02 PM on 05/23/2019 -----------------------------------------  Blood pressure (!) 120/91, pulse 80, temperature 98 F (36.7 C), temperature source Oral, resp. rate 16, SpO2 100 %.  Assuming care from Dr. Cyril Loosen.  In short, Erin [name] is a 44 y.o. female with a chief complaint of Allergic Reaction .  Refer to the original H&P for additional details.  The current plan of care is to reassess for recurrence of anaphylactic reaction.  ----------------------------------------- 3:35 AM on 05/24/2019 -----------------------------------------  Patient with significant improvement in swelling around eyes, no difficulty breathing and itching has resolved.  She has not had any further vomiting or diarrhea, vital signs remained stable.  At this time, she is appropriate for discharge home, will prescribe EpiPen and course of steroids.  She was counseled to establish care with a PCP and consider follow-up with an allergist.  Patient agrees with plan.      Chesley Noon, MD 05/24/19 [phone]

## 2019-05-24 MED ORDER — EPINEPHRINE 0.3 MG/0.3ML IJ SOAJ: 0.3000 mg | INTRAMUSCULAR | 0 refills | Status: DC | PRN

## 2019-05-24 MED ORDER — PREDNISONE 20 MG PO TABS: 60.0000 mg | ORAL_TABLET | Freq: Every day | ORAL | 0 refills | Status: AC

## 2019-05-24 MED ORDER — ACETAMINOPHEN 500 MG PO TABS
1000.0000 mg | ORAL_TABLET | Freq: Once | ORAL | Status: AC
  Administered 2019-05-24: 03:00:00 1000 mg via ORAL
  Filled 2019-05-24: qty 2

## 2022-11-06 ENCOUNTER — Other Ambulatory Visit: Payer: Self-pay

## 2022-11-06 ENCOUNTER — Encounter: Payer: Self-pay | Admitting: Emergency Medicine

## 2022-11-06 ENCOUNTER — Emergency Department
Admission: EM | Admit: 2022-11-06 | Discharge: 2022-11-06 | Disposition: A | Discharge status: Home / Self Care | Payer: 59 | Attending: Emergency Medicine | Admitting: Emergency Medicine

## 2022-11-06 DIAGNOSIS — J45909 Unspecified asthma, uncomplicated: Secondary | ICD-10-CM | POA: Diagnosis not present

## 2022-11-06 DIAGNOSIS — T783XXA Angioneurotic edema, initial encounter: Secondary | ICD-10-CM | POA: Diagnosis not present

## 2022-11-06 DIAGNOSIS — T782XXA Anaphylactic shock, unspecified, initial encounter: Secondary | ICD-10-CM | POA: Insufficient documentation

## 2022-11-06 DIAGNOSIS — T7840XA Allergy, unspecified, initial encounter: Secondary | ICD-10-CM

## 2022-11-06 MED ORDER — FAMOTIDINE 20 MG PO TABS: 20.0000 mg | ORAL_TABLET | Freq: Two times a day (BID) | ORAL | 0 refills | Status: AC

## 2022-11-06 MED ORDER — FAMOTIDINE IN NACL 20-0.9 MG/50ML-% IV SOLN
20.0000 mg | Freq: Once | INTRAVENOUS | Status: AC
  Administered 2022-11-06: 20 mg via INTRAVENOUS
  Filled 2022-11-06: qty 50

## 2022-11-06 MED ORDER — SODIUM CHLORIDE 0.9 % IV BOLUS
1000.0000 mL | Freq: Once | INTRAVENOUS | Status: AC
  Administered 2022-11-06: 1000 mL via INTRAVENOUS

## 2022-11-06 MED ORDER — METHYLPREDNISOLONE SODIUM SUCC 125 MG IJ SOLR
125.0000 mg | Freq: Once | INTRAMUSCULAR | Status: AC
  Administered 2022-11-06: 125 mg via INTRAVENOUS
  Filled 2022-11-06: qty 2

## 2022-11-06 MED ORDER — EPINEPHRINE 0.3 MG/0.3ML IJ SOAJ: 0.3000 mg | Freq: Once | INTRAMUSCULAR | 2 refills | Status: AC

## 2022-11-06 MED ORDER — PREDNISONE 20 MG PO TABS: ORAL_TABLET | ORAL | 0 refills | Status: AC

## 2022-11-06 NOTE — Discharge Instructions (Signed)
1. Take the following medicines for the next 4 days: Prednisone 60mg daily Pepcid 20mg twice daily 2. Take Benadryl as needed for itching. 3. Use Epi-Pen in case of acute, life-threatening allergic reaction. 4. Return to the ER for worsening symptoms, persistent vomiting, difficulty breathing or other concerns.  

## 2022-11-06 NOTE — ED Notes (Signed)
Pt ambulated to restroom and back to bed safely.

## 2022-11-06 NOTE — ED Notes (Signed)
Assumed care of pt at this time. Pt is AAXO4, able to speak in full sentences, bilateral eye swelling noted due to allergic reaction to xantham gum per pt. Pt woke up at midnight with swelling. Pt placed on CCM, VS stable and WNL. Pt accompanied by son.

## 2022-11-06 NOTE — ED Triage Notes (Addendum)
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

## 2022-11-06 NOTE — ED Notes (Signed)
ED Provider at bedside. 

## 2022-11-06 NOTE — ED Provider Notes (Signed)
[facility] Provider Note    Event Date/Time   First MD Initiated Contact with Patient 11/06/22 0113     (approximate)   History   Allergic Reaction   HPI  Erin [name] is a 47 y.o. female presents to the ED from home with a chief complaint of allergic reaction.  Patient is allergic to [name], Erin [name], Erin [name], feeling short of breath.  Took 2 oral Benadryl and EpiPen prior to arrival.  Denies chest pain, abdominal pain, nausea, vomiting or diarrhea.     Past Medical History   Past Medical History:  Diagnosis Date   Allergy    Asthma    Frequent headaches    Migraine    Panic attacks    Hx of - she has had approximately 5-6 in 13 years.     Active Problem List   Patient Active Problem List   Diagnosis Date Noted   Abnormal uterine bleeding 06/27/2014   Viral conjunctivitis 04/21/2014   Tendonitis of elbow, right 02/04/2013   Encounter for cervical Pap smear with pelvic exam 12/15/2012   Asthma 12/01/2012   Sinus headache 12/01/2012   Multiple allergies 12/01/2012   Routine general medical examination at a health care facility 12/01/2012     Past Surgical History   Past Surgical History:  Procedure Laterality Date   CESAREAN SECTION  2000, 2004   DILATION AND CURETTAGE OF UTERUS  2001     Home Medications   Prior to Admission medications   Medication Sig Start Date End Date Taking? Authorizing Provider  cyclobenzaprine (FLEXERIL) 10 MG tablet Take 1 tablet (10 mg total) by mouth 3 (three) times daily as needed for muscle spasms. 02/20/16   Cuthriell, Delorise Royals, PA-C  EPINEPHrine 0.3 mg/0.3 mL IJ SOAJ injection Inject 0.3 mLs (0.3 mg total) into the muscle as needed for anaphylaxis. 05/24/19   Chesley Noon, MD  HYDROcodone-acetaminophen (NORCO/VICODIN) 5-325 MG tablet Take 1 tablet by mouth every 4 (four) hours as needed for moderate  pain. 02/20/16   Cuthriell, Delorise Royals, PA-C  Multiple Vitamin (MULTIVITAMIN) capsule Take 1 capsule by mouth daily.    [provider]     Allergies  Xanthan gum   Family History   Family History  Problem Relation Age of Onset   Arthritis Mother    Heart disease Mother    Hypertension Mother    Arrhythmia Mother    Asthma Brother    Cancer Maternal Aunt 2       breast cancer - died age 53   Sleep apnea Brother    Asthma Father    Cirrhosis Father    Hepatitis C Father        Contracted through IV - heroin     Physical Exam  Triage Vital Signs: ED Triage Vitals  Encounter Vitals Group     BP 11/06/22 0105 (!) 159/147     Systolic BP Percentile --      Diastolic BP Percentile --      Pulse Rate 11/06/22 0105 86     Resp 11/06/22 0105 16     Temp 11/06/22 0105 98.3 F (36.8 C)     Temp Source 11/06/22 0105 Oral     SpO2 11/06/22 0105 100 %     Weight 11/06/22 0104 145 lb (65.8 kg)     Height 11/06/22 0104 5\' 6"  (1.676 m)  Head Circumference --      Peak Flow --      Pain Score 11/06/22 0104 0     Pain Loc --      Pain Education --      Exclude from Growth Chart --     Updated Vital Signs: BP (!) 146/95   Pulse 85   Temp 98.3 F (36.8 C) (Oral)   Resp 20   Ht 5\' 6"  (1.676 m)   Wt 65.8 kg   LMP 10/23/2022 (Exact Date)   SpO2 100%   BMI 23.40 kg/m    General: Awake, mild to moderate distress.  CV:  RRR.  Good peripheral perfusion.  Resp:  Normal effort.  CTAB. Abd:  Nontender to light or deep palpation.  No distention.  Other:  Periorbital edema bilaterally, right> left.  No tongue or lip [name].  Posterior oropharynx is clear.  Phonation intact.  There is no hoarse or muffled voice.  There is no drooling.  No palpable neck masses.   ED Results / Procedures / Treatments  Labs (all labs ordered are listed, but only abnormal results are displayed) Labs Reviewed - No data to display   EKG  ED ECG REPORT I, Elohim Brune J, the  attending physician, personally viewed and interpreted this ECG.   Date: 11/06/2022  EKG Time: 0105  Rate: 84  Rhythm: normal sinus rhythm  Axis: Normal  Intervals:none  ST&T Change: Nonspecific    RADIOLOGY None   Official radiology report(s): No results found.   PROCEDURES:  Critical Care performed: Yes, see critical care procedure note(s)  CRITICAL CARE Performed by: Irean Hong   Total critical care time: 45 minutes  Critical care time was exclusive of separately billable procedures and treating other patients.  Critical care was necessary to treat or prevent imminent or life-threatening deterioration.  Critical care was time spent personally by me on the following activities: development of treatment plan with patient and/or surrogate as well as nursing, discussions with consultants, evaluation of patient's response to treatment, examination of patient, obtaining history from patient or surrogate, ordering and performing treatments and interventions, ordering and review of laboratory studies, ordering and review of radiographic studies, pulse oximetry and re-evaluation of patient's condition.   Marland Kitchen1-3 Lead EKG Interpretation  Performed by: Irean Hong, MD Authorized by: Irean Hong, MD     Interpretation: normal     ECG rate:  85   ECG rate assessment: normal     Rhythm: sinus rhythm     Ectopy: none     Conduction: normal   Comments:     Patient placed on cardiac monitor to evaluate for arrhythmias    MEDICATIONS ORDERED IN ED: Medications  sodium chloride 0.9 % bolus 1,000 mL (has no administration in time range)  methylPREDNISolone sodium succinate (SOLU-MEDROL) 125 mg/2 mL injection 125 mg (has no administration in time range)  famotidine (PEPCID) IVPB 20 mg premix (has no administration in time range)     IMPRESSION / MDM / ASSESSMENT AND PLAN / ED COURSE  I reviewed the triage vital signs and the nursing notes.                              47 year old female presenting with anaphylaxis.  I personally reviewed patient's records and note an orthopedic surgery office visit on 10/21/2022 for hand cyst.  Patient's presentation is most consistent with acute presentation with potential threat to life  or bodily function.  The patient is on the cardiac monitor to evaluate for evidence of arrhythmia and/or significant heart rate changes.  Patient took EpiPen and 50 mg oral Benadryl prior to arrival.  Will add IV fluids, Solu-Medrol and Pepcid.  Will monitor and care for patient.  Will reassess.  Clinical Course as of 11/06/22 0626  Wed Nov 06, 2022  6644 Bilateral periorbital [name] significantly better; patient now able to open right eye.  Will continue to monitor and care for patient. [JS]  0553 Periorbital [name] continues to improve.  Room air saturation 97%.  No tongue or lip angioedema.  Posterior oropharynx remains clear with normal phonation.  Will discharge home with prednisone burst, Pepcid and refill EpiPen.  Strict return precautions given.  Patient verbalizes understanding and agrees with plan of care. [JS]    Clinical Course User Index [JS] Irean Hong, MD     FINAL CLINICAL IMPRESSION(S) / ED DIAGNOSES   Final diagnoses:  Allergic reaction, initial encounter  Angioedema, initial encounter  Anaphylaxis, initial encounter     Rx / DC Orders   ED Discharge Orders     None        Note:  This document was prepared using Dragon voice recognition software and may include unintentional dictation errors.   Irean Hong, MD 11/06/22 [phone]

## 2022-11-06 NOTE — ED Notes (Signed)
Provided pt with discharge instructions, paper RX's, and education. All of pt questions answered. Pt in possession of all belongings. Pt AAOX4 and stable at time of discharge. Pt states son is enroute for tx home. Pt ambulated w/ steady gait towards ED exit.
